# Patient Record
Sex: Male | Born: 1975 | Race: Black or African American | Hispanic: No | Marital: Single | Smoking: Never smoker
Health system: Southern US, Community
[De-identification: ages and names within clinical notes are randomized; demographics above are authoritative.]

---

## 2018-11-07 ENCOUNTER — Other Ambulatory Visit: Payer: Self-pay

## 2018-11-07 ENCOUNTER — Emergency Department (HOSPITAL_COMMUNITY)
Admission: EM | Admit: 2018-11-07 | Discharge: 2018-11-07 | Disposition: A | Discharge status: Home / Self Care | Payer: No Typology Code available for payment source | Attending: Emergency Medicine | Admitting: Emergency Medicine

## 2018-11-07 ENCOUNTER — Encounter (HOSPITAL_COMMUNITY): Payer: Self-pay | Admitting: *Deleted

## 2018-11-07 ENCOUNTER — Emergency Department (HOSPITAL_COMMUNITY): Payer: No Typology Code available for payment source

## 2018-11-07 DIAGNOSIS — M25511 Pain in right shoulder: Secondary | ICD-10-CM | POA: Diagnosis not present

## 2018-11-07 DIAGNOSIS — Y999 Unspecified external cause status: Secondary | ICD-10-CM | POA: Insufficient documentation

## 2018-11-07 DIAGNOSIS — Y9389 Activity, other specified: Secondary | ICD-10-CM | POA: Diagnosis not present

## 2018-11-07 DIAGNOSIS — Y92410 Unspecified street and highway as the place of occurrence of the external cause: Secondary | ICD-10-CM | POA: Insufficient documentation

## 2018-11-07 DIAGNOSIS — M545 Low back pain, unspecified: Secondary | ICD-10-CM

## 2018-11-07 MED ORDER — METHOCARBAMOL 500 MG PO TABS: 500.0000 mg | ORAL_TABLET | Freq: Two times a day (BID) | ORAL | 0 refills | Status: AC

## 2018-11-07 MED ORDER — IBUPROFEN 800 MG PO TABS
800.0000 mg | ORAL_TABLET | Freq: Once | ORAL | Status: AC
  Administered 2018-11-07: 800 mg via ORAL
  Filled 2018-11-07: qty 1
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
## 2018-11-07 NOTE — Discharge Instructions (Signed)
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
## 2019-07-27 ENCOUNTER — Emergency Department (HOSPITAL_COMMUNITY): Payer: Self-pay

## 2019-07-27 ENCOUNTER — Encounter (HOSPITAL_COMMUNITY): Payer: Self-pay

## 2019-07-27 ENCOUNTER — Emergency Department (HOSPITAL_COMMUNITY)
Admission: EM | Admit: 2019-07-27 | Discharge: 2019-07-27 | Disposition: A | Discharge status: Home / Self Care | Payer: Self-pay | Attending: Emergency Medicine | Admitting: Emergency Medicine

## 2019-07-27 ENCOUNTER — Other Ambulatory Visit: Payer: Self-pay

## 2019-07-27 DIAGNOSIS — J189 Pneumonia, unspecified organism: Secondary | ICD-10-CM | POA: Insufficient documentation

## 2019-07-27 DIAGNOSIS — M791 Myalgia, unspecified site: Secondary | ICD-10-CM | POA: Insufficient documentation

## 2019-07-27 DIAGNOSIS — Z20828 Contact with and (suspected) exposure to other viral communicable diseases: Secondary | ICD-10-CM | POA: Insufficient documentation

## 2019-07-27 LAB — URINALYSIS, ROUTINE W REFLEX MICROSCOPIC
Bilirubin Urine: NEGATIVE
Glucose, UA: NEGATIVE mg/dL
Hgb urine dipstick: NEGATIVE
Ketones, ur: 5 mg/dL — AB
Leukocytes,Ua: NEGATIVE
Nitrite: NEGATIVE
Protein, ur: NEGATIVE mg/dL
Specific Gravity, Urine: 1.011 (ref 1.005–1.030)
pH: 7 (ref 5.0–8.0)

## 2019-07-27 LAB — SARS CORONAVIRUS 2 (TAT 6-24 HRS): SARS Coronavirus 2: NEGATIVE

## 2019-07-27 LAB — BASIC METABOLIC PANEL
Anion gap: 9 (ref 5–15)
BUN: 7 mg/dL (ref 6–20)
CO2: 27 mmol/L (ref 22–32)
Calcium: 9.1 mg/dL (ref 8.9–10.3)
Chloride: 101 mmol/L (ref 98–111)
Creatinine, Ser: 1.34 mg/dL — ABNORMAL HIGH (ref 0.61–1.24)
GFR calc Af Amer: >60 mL/min (ref >60)
GFR calc non Af Amer: >60 mL/min (ref >60)
Glucose, Bld: 96 mg/dL (ref 70–99)
Potassium: 4.2 mmol/L (ref 3.5–5.1)
Sodium: 137 mmol/L (ref 135–145)

## 2019-07-27 LAB — CBC WITH DIFFERENTIAL/PLATELET
Abs Immature Granulocytes: 0.04 10*3/uL (ref 0.00–0.07)
Basophils Absolute: 0 10*3/uL (ref 0.0–0.1)
Basophils Relative: 0 %
Eosinophils Absolute: 0.1 10*3/uL (ref 0.0–0.5)
Eosinophils Relative: 1 %
HCT: 41.2 % (ref 39.0–52.0)
Hemoglobin: 13 g/dL (ref 13.0–17.0)
Immature Granulocytes: 1 %
Lymphocytes Relative: 14 %
Lymphs Abs: 1 10*3/uL (ref 0.7–4.0)
MCH: 28.3 pg (ref 26.0–34.0)
MCHC: 31.6 g/dL (ref 30.0–36.0)
MCV: 89.8 fL (ref 80.0–100.0)
Monocytes Absolute: 0.8 10*3/uL (ref 0.1–1.0)
Monocytes Relative: 11 %
Neutro Abs: 5.4 10*3/uL (ref 1.7–7.7)
Neutrophils Relative %: 73 %
Platelets: 353 10*3/uL (ref 150–400)
RBC: 4.59 MIL/uL (ref 4.22–5.81)
RDW: 13.7 % (ref 11.5–15.5)
WBC: 7.4 10*3/uL (ref 4.0–10.5)
nRBC: 0 % (ref 0.0–0.2)

## 2019-07-27 LAB — LACTIC ACID, PLASMA: Lactic Acid, Venous: 1.3 mmol/L (ref 0.5–1.9)

## 2019-07-27 MED ORDER — AZITHROMYCIN 250 MG PO TABS
500.0000 mg | ORAL_TABLET | Freq: Once | ORAL | Status: AC
  Administered 2019-07-27: 500 mg via ORAL
  Filled 2019-07-27: qty 2

## 2019-07-27 MED ORDER — LACTATED RINGERS IV BOLUS
1000.0000 mL | Freq: Once | INTRAVENOUS | Status: AC
  Administered 2019-07-27: 1000 mL via INTRAVENOUS

## 2019-07-27 MED ORDER — AZITHROMYCIN 250 MG PO TABS: 250.0000 mg | ORAL_TABLET | Freq: Every day | ORAL | 0 refills | Status: AC

## 2019-07-27 MED ORDER — SODIUM CHLORIDE 0.9 % IV SOLN
1.0000 g | Freq: Once | INTRAVENOUS | Status: AC
  Administered 2019-07-27: 1 g via INTRAVENOUS
  Filled 2019-07-27: qty 10

## 2019-07-27 MED ORDER — ACETAMINOPHEN 325 MG PO TABS
650.0000 mg | ORAL_TABLET | Freq: Once | ORAL | Status: AC
  Administered 2019-07-27: 650 mg via ORAL
  Filled 2019-07-27: qty 2

## 2019-07-27 MED ORDER — AMOXICILLIN 500 MG PO CAPS: 1000.0000 mg | ORAL_CAPSULE | Freq: Two times a day (BID) | ORAL | 0 refills | Status: AC

## 2019-07-27 MED ORDER — IBUPROFEN 200 MG PO TABS
600.0000 mg | ORAL_TABLET | Freq: Once | ORAL | Status: AC
  Administered 2019-07-27: 600 mg via ORAL
  Filled 2019-07-27: qty 3

## 2019-07-27 NOTE — ED Triage Notes (Signed)
Pt presents with c/o generalized body aches, cough, shortness of breath for 7-10 days. Pt denies coming in contact with anyone else who has been sick. Pt reports sweats. Pt was lethargic upon arrival to ER but upon arrival to room, pt starting panicking and hyperventilating.

## 2019-07-31 NOTE — ED Provider Notes (Signed)
[facility]-EMERGENCY DEPT Provider Note   CSN: [id number] Arrival date & time: 07/27/19  1025     History   Chief Complaint Chief Complaint  Patient presents with  . Shortness of Breath  . Cough  . Generalized Body Aches    HPI Micheal Le is a 43 y.o. male.     HPI   43 year old male with generalized body aches, cough and shortness of breath.  Onset about a week ago.  Worse in the past 2 to 3 days.  Subjective fevers and chills.  Nauseated.  Poor appetite.  No unusual leg pain or swelling.  No sick contacts that he is aware of.  History reviewed. No pertinent past medical history.  There are no active problems to display for this patient.   History reviewed. No pertinent surgical history.      Home Medications    Prior to Admission medications   Medication Sig Start Date End Date Taking? Authorizing Provider  amoxicillin (AMOXIL) 500 MG capsule Take 2 capsules (1,000 mg total) by mouth 2 (two) times daily. 07/27/19   Raeford Razor, MD  azithromycin (ZITHROMAX) 250 MG tablet Take 1 tablet (250 mg total) by mouth daily. Take first 2 tablets together, then 1 every day until finished. 07/27/19   Raeford Razor, MD  methocarbamol (ROBAXIN) 500 MG tablet Take 1 tablet (500 mg total) by mouth 2 (two) times daily. Patient not taking: Reported on 07/27/2019 11/07/18   Legrand Rams    Family History History reviewed. No pertinent family history.  Social History Social History   Tobacco Use  . Smoking status: Never Smoker  . Smokeless tobacco: Never Used  Substance Use Topics  . Alcohol use: Never    Frequency: Never  . Drug use: Never     Allergies   Patient has no known allergies.   Review of Systems Review of Systems  All systems reviewed and negative, other than as noted in HPI.  Physical Exam Updated Vital Signs BP 121/77 (BP Location: Right Arm)   Pulse 71   Temp 98.8 F (37.1 C) (Oral)   Resp (!) 24   SpO2 97%    Physical Exam Vitals signs and nursing note reviewed.  Constitutional:      Appearance: He is well-developed.  HENT:     Head: Normocephalic and atraumatic.  Eyes:     General:        Right eye: No discharge.        Left eye: No discharge.     Conjunctiva/sclera: Conjunctivae normal.  Neck:     Musculoskeletal: Neck supple.  Cardiovascular:     Rate and Rhythm: Normal rate and regular rhythm.     Heart sounds: Normal heart sounds. No murmur. No friction rub. No gallop.   Pulmonary:     Effort: No respiratory distress.     Breath sounds: Normal breath sounds.     Comments: Mild tachypnea Abdominal:     General: There is no distension.     Palpations: Abdomen is soft.     Tenderness: There is no abdominal tenderness.  Musculoskeletal:        General: No tenderness.  Skin:    General: Skin is warm and dry.  Neurological:     Mental Status: He is alert.  Psychiatric:        Behavior: Behavior normal.        Thought Content: Thought content normal.      ED Treatments / Results  Labs (all labs ordered are listed, but only abnormal results are displayed) Labs Reviewed  BASIC METABOLIC PANEL - Abnormal; Notable for the following components:      Result Value   Creatinine, Ser 1.34 (*)    All other components within normal limits  URINALYSIS, ROUTINE W REFLEX MICROSCOPIC - Abnormal; Notable for the following components:   Ketones, ur 5 (*)    All other components within normal limits  CULTURE, BLOOD (ROUTINE X 2)  CULTURE, BLOOD (ROUTINE X 2)  SARS CORONAVIRUS 2 (TAT 6-24 HRS)  CBC WITH DIFFERENTIAL/PLATELET  LACTIC ACID, PLASMA    EKG EKG Interpretation  Date/Time:  Thursday July 27 2019 10:51:13 EST Ventricular Rate:  87 PR Interval:    QRS Duration: 87 QT Interval:  372 QTC Calculation: 448 R Axis:   29 Text Interpretation: Sinus rhythm Borderline T wave abnormalities Confirmed by Virgel Manifold [phone]) on 07/27/2019 12:58:59 PM   Radiology No  results found.   Dg Chest Portable 1 View  Result Date: 07/27/2019 CLINICAL DATA:  Fever EXAM: PORTABLE CHEST 1 VIEW COMPARISON:  None. FINDINGS: There is airspace opacity in the left lower lobe. The lungs elsewhere are clear. Heart is upper normal in size with pulmonary vascularity normal. No adenopathy. No bone lesions. IMPRESSION: Left lower lobe airspace opacity consistent with pneumonia. Lungs elsewhere are clear. Heart is upper normal in size. No adenopathy evident. Electronically Signed   By: Lowella Grip III M.D.   On: 07/27/2019 12:29     Procedures Procedures (including critical care time)  Medications Ordered in ED Medications  ibuprofen (ADVIL) tablet 600 mg (600 mg Oral Given 07/27/19 1139)  acetaminophen (TYLENOL) tablet 650 mg (650 mg Oral Given 07/27/19 1139)  lactated ringers bolus 1,000 mL (0 mLs Intravenous Stopped 07/27/19 1348)  cefTRIAXone (ROCEPHIN) 1 g in sodium chloride 0.9 % 100 mL IVPB (0 g Intravenous Stopped 07/27/19 1455)  azithromycin (ZITHROMAX) tablet 500 mg (500 mg Oral Given 07/27/19 1400)     Initial Impression / Assessment and Plan / ED Course  I have reviewed the triage vital signs and the nursing notes.  Pertinent labs & imaging results that were available during my care of the patient were reviewed by me and considered in my medical decision making (see chart for details).       43 year old male with what is likely community-acquired pneumonia.  A little bit tachypneic, but he looks reasonably comfortable just at rest.  O2 sats are good on room air.I think he is fine for outpt tx at this time.  Final Clinical Impressions(s) / ED Diagnoses   Final diagnoses:  Community acquired pneumonia of left lower lobe of lung    ED Discharge Orders         Ordered    amoxicillin (AMOXIL) 500 MG capsule  2 times daily     07/27/19 1410    azithromycin (ZITHROMAX) 250 MG tablet  Daily     07/27/19 1410           Virgel Manifold, MD 07/31/19  1103

## 2019-08-01 LAB — CULTURE, BLOOD (ROUTINE X 2)
Culture: NO GROWTH
Culture: NO GROWTH
Special Requests: ADEQUATE
Special Requests: ADEQUATE
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
## 2020-07-15 IMAGING — DX DG CHEST 1V PORT
1 series · 1 of 1 positions shown · non-contrast
Comparison: None.

CLINICAL DATA: Fever

EXAM:
PORTABLE CHEST 1 VIEW

[chest ap]
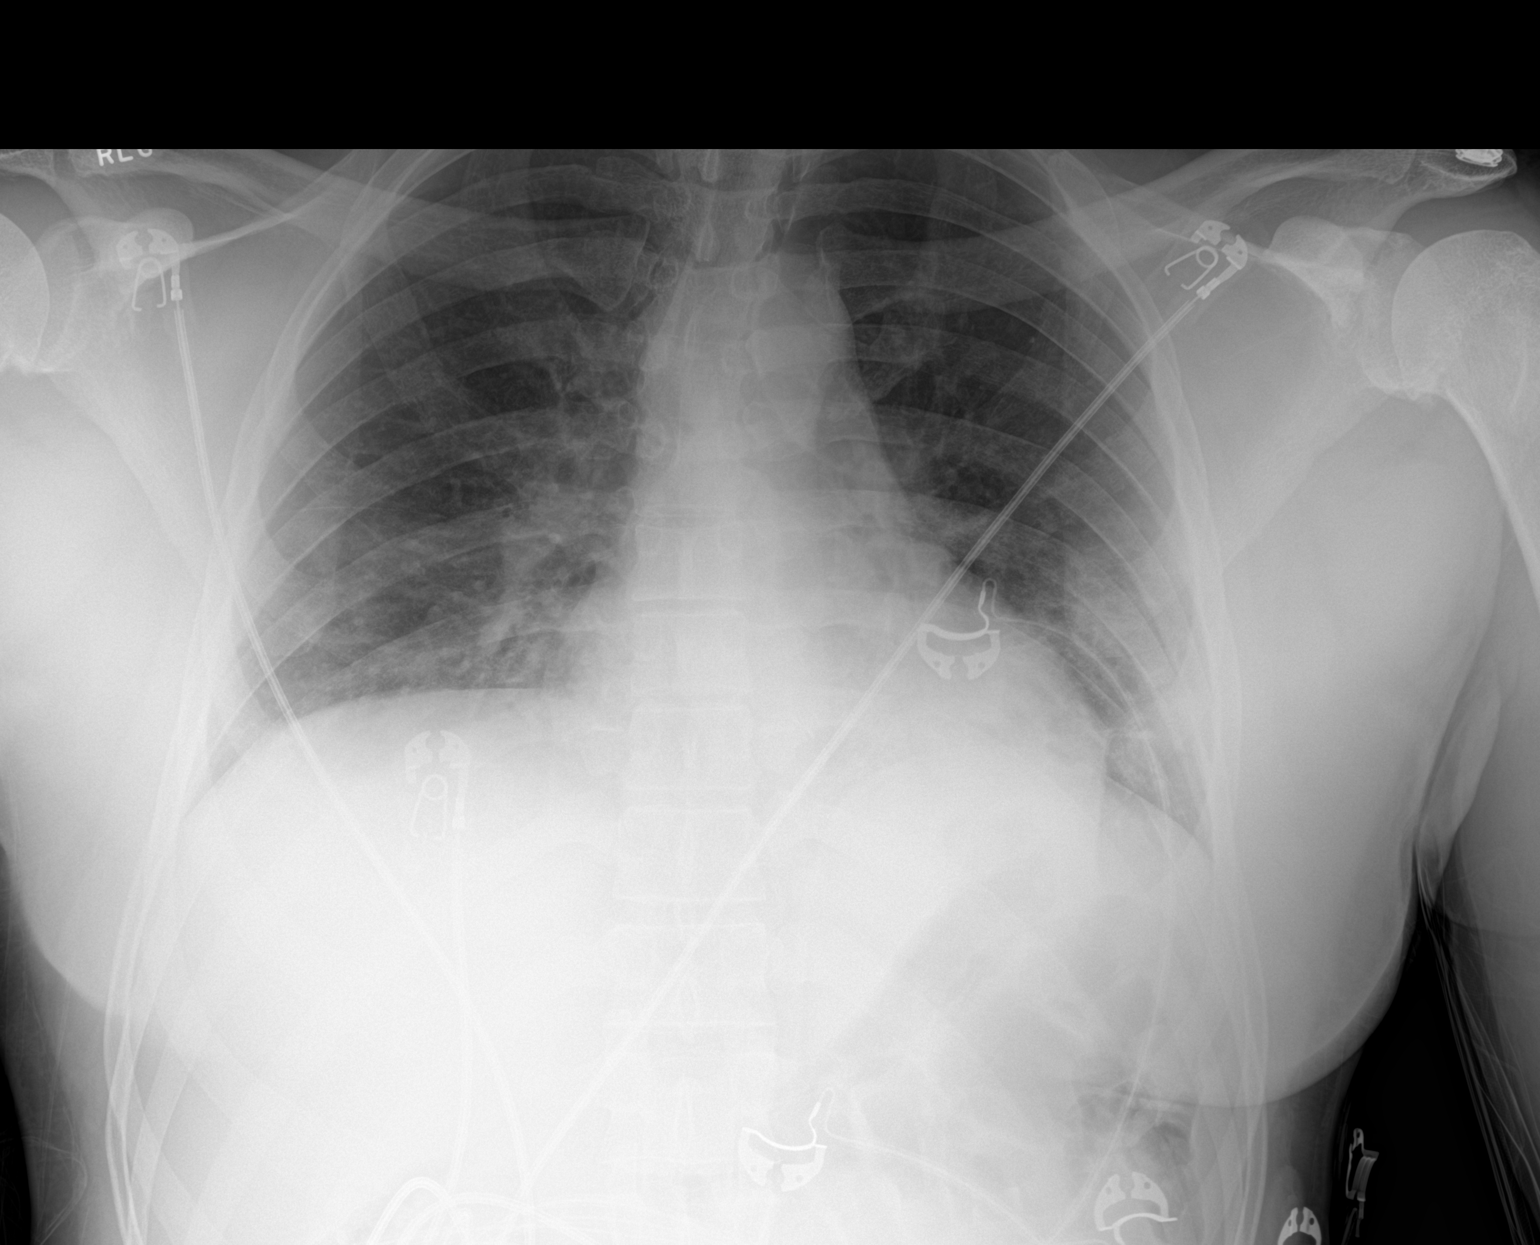

[1 of 1 positions shown; findings below may reference images not displayed]

FINDINGS: There is airspace opacity in the left lower lobe. The lungs
elsewhere are clear. Heart is upper normal in size with pulmonary
vascularity normal. No adenopathy. No bone lesions.
IMPRESSION: Left lower lobe airspace opacity consistent with pneumonia. Lungs
elsewhere are clear. Heart is upper normal in size. No adenopathy
evident.
